# Patient Record
Sex: Female | Born: 1985 | Marital: Single | Smoking: Never smoker
Health system: Southern US, Community
[De-identification: ages and names within clinical notes are randomized; demographics above are authoritative.]

## PROBLEM LIST (undated history)

## (undated) DIAGNOSIS — I82409 Acute embolism and thrombosis of unspecified deep veins of unspecified lower extremity: Secondary | ICD-10-CM

## (undated) DIAGNOSIS — J45909 Unspecified asthma, uncomplicated: Secondary | ICD-10-CM

## (undated) DIAGNOSIS — I2699 Other pulmonary embolism without acute cor pulmonale: Secondary | ICD-10-CM

## (undated) HISTORY — PX: BACK SURGERY: SHX140

## (undated) HISTORY — DX: Unspecified asthma, uncomplicated: J45.909

---

## 2011-10-15 ENCOUNTER — Other Ambulatory Visit (HOSPITAL_COMMUNITY)
Admission: RE | Admit: 2011-10-15 | Discharge: 2011-10-15 | Disposition: A | Discharge status: Home / Self Care | Payer: 59 | Source: Ambulatory Visit | Attending: Family Medicine | Admitting: Family Medicine

## 2011-10-15 DIAGNOSIS — Z0000 Encounter for general adult medical examination without abnormal findings: Secondary | ICD-10-CM | POA: Insufficient documentation

## 2015-02-10 DIAGNOSIS — E7800 Pure hypercholesterolemia, unspecified: Secondary | ICD-10-CM | POA: Insufficient documentation

## 2016-07-17 ENCOUNTER — Emergency Department (HOSPITAL_COMMUNITY)
Admission: EM | Admit: 2016-07-17 | Discharge: 2016-07-17 | Disposition: A | Discharge status: Home / Self Care | Payer: BLUE CROSS/BLUE SHIELD | Attending: Emergency Medicine | Admitting: Emergency Medicine

## 2016-07-17 ENCOUNTER — Encounter (HOSPITAL_COMMUNITY): Payer: Self-pay | Admitting: Emergency Medicine

## 2016-07-17 DIAGNOSIS — M542 Cervicalgia: Secondary | ICD-10-CM | POA: Diagnosis present

## 2016-07-17 DIAGNOSIS — M62838 Other muscle spasm: Secondary | ICD-10-CM

## 2016-07-17 DIAGNOSIS — M62830 Muscle spasm of back: Secondary | ICD-10-CM | POA: Insufficient documentation

## 2016-07-17 DIAGNOSIS — M549 Dorsalgia, unspecified: Secondary | ICD-10-CM

## 2016-07-17 MED ORDER — METHOCARBAMOL 500 MG PO TABS: 1000.0000 mg | ORAL_TABLET | Freq: Four times a day (QID) | ORAL | 0 refills | Status: DC

## 2016-07-17 MED ORDER — OXYCODONE-ACETAMINOPHEN 5-325 MG PO TABS
ORAL_TABLET | ORAL | Status: AC
  Filled 2016-07-17: qty 1

## 2016-07-17 MED ORDER — HYDROCODONE-ACETAMINOPHEN 5-325 MG PO TABS: ORAL_TABLET | ORAL | 0 refills | Status: DC

## 2016-07-17 MED ORDER — KETOROLAC TROMETHAMINE 15 MG/ML IJ SOLN
15.0000 mg | Freq: Once | INTRAMUSCULAR | Status: AC
  Administered 2016-07-17: 15 mg via INTRAVENOUS
  Filled 2016-07-17: qty 1

## 2016-07-17 MED ORDER — OXYCODONE-ACETAMINOPHEN 5-325 MG PO TABS
1.0000 | ORAL_TABLET | Freq: Once | ORAL | Status: AC
  Administered 2016-07-17: 1 via ORAL

## 2016-07-17 MED ORDER — HYDROMORPHONE HCL 1 MG/ML IJ SOLN
0.5000 mg | Freq: Once | INTRAMUSCULAR | Status: AC
  Administered 2016-07-17: 0.5 mg via INTRAVENOUS
  Filled 2016-07-17: qty 1

## 2016-07-17 MED ORDER — ONDANSETRON HCL 4 MG/2ML IJ SOLN
4.0000 mg | Freq: Once | INTRAMUSCULAR | Status: AC
  Administered 2016-07-17: 4 mg via INTRAVENOUS
  Filled 2016-07-17: qty 2

## 2016-07-17 MED ORDER — DIAZEPAM 5 MG PO TABS
5.0000 mg | ORAL_TABLET | Freq: Once | ORAL | Status: AC
  Administered 2016-07-17: 5 mg via ORAL
  Filled 2016-07-17: qty 1

## 2016-07-17 MED ORDER — NAPROXEN 500 MG PO TABS: 500.0000 mg | ORAL_TABLET | Freq: Two times a day (BID) | ORAL | 0 refills | Status: DC

## 2016-07-17 MED ORDER — FENTANYL CITRATE (PF) 100 MCG/2ML IJ SOLN
50.0000 ug | Freq: Once | INTRAMUSCULAR | Status: AC
  Administered 2016-07-17: 50 ug via INTRAVENOUS
  Filled 2016-07-17: qty 2

## 2016-07-17 NOTE — ED Notes (Signed)
Pt requested a ice pack for neck. Pt given a small ice pack for back of neck and a large ice pack for back pain

## 2016-07-17 NOTE — Discharge Instructions (Signed)
Please read and follow all provided instructions.  Your diagnoses today include:  1. Muscle spasm   2. Acute upper back pain     Tests performed today include:  Vital signs - see below for your results today  Medications prescribed:   Robaxin (methocarbamol) - muscle relaxer medication  DO NOT drive or perform any activities that require you to be awake and alert because this medicine can make you drowsy.    Naproxen - anti-inflammatory pain medication  Do not exceed 500mg  naproxen every 12 hours, take with food  You have been prescribed an anti-inflammatory medication or NSAID. Take with food. Take smallest effective dose for the shortest duration needed for your pain. Stop taking if you experience stomach pain or vomiting.    Vicodin (hydrocodone/acetaminophen) - narcotic pain medication  DO NOT drive or perform any activities that require you to be awake and alert because this medicine can make you drowsy. BE VERY CAREFUL not to take multiple medicines containing Tylenol (also called acetaminophen). Doing so can lead to an overdose which can damage your liver and cause liver failure and possibly death.  Take any prescribed medications only as directed.  Home care instructions:   Follow any educational materials contained in this packet  Please rest, use ice or heat on your back for the next several days  Do not lift, push, pull anything more than 10 pounds for the next week  Follow-up instructions: Please follow-up with your primary care provider in the next 1 week for further evaluation of your symptoms.   Return instructions:  SEEK IMMEDIATE MEDICAL ATTENTION IF YOU HAVE:  New numbness, tingling, weakness, or problem with the use of your arms or legs  Severe back pain not relieved with medications  Loss control of your bowels or bladder  Increasing pain in any areas of the body (such as chest or abdominal pain)  Shortness of breath, dizziness, or fainting.     Worsening nausea (feeling sick to your stomach), vomiting, fever, or sweats  Any other emergent concerns regarding your health   Additional Information:  Your vital signs today were: BP 120/77    Pulse 65    Temp 98.4 F (36.9 C) (Oral)    Resp 18    Ht 5\' 9"  (1.753 m)    Wt 170 lb (77.1 kg)    LMP 05/03/2016 (Approximate)    SpO2 99%    BMI 25.10 kg/m  If your blood pressure (BP) was elevated above 135/85 this visit, please have this repeated by your doctor within one month. --------------

## 2016-07-17 NOTE — ED Triage Notes (Signed)
Pt reports a hx of neck and back spasms however she is now experiencing the worse spasms ever.  States she has been lifting boxes and moving things then picked up her dog which she thinks caused this spasm.  She is unable to get relief w/ ibuprofen or rest as she usually does.

## 2016-07-17 NOTE — ED Provider Notes (Signed)
[facility]-EMERGENCY DEPT Provider Note   CSN: [id number] Arrival date & time: 07/17/16  0419     History   Chief Complaint Chief Complaint  Patient presents with  . Back Pain  . Neck Pain    HPI Bianca Jacobs is a 31 y.o. female.  Patient with no significant past medical problems presents to the emergency department with complaint of bilateral neck, back, shoulder and upper arm pain over the past 2 days. Symptoms were mild at onset. They became worse yesterday after she bent over to separate her dogs. Overnight they became severe prompting emergency department visit. She also has been moving and lifting boxes. Patient took ibuprofen without relief. No vision changes, weakness in her arms, difficulty walking. No other recent trauma or manipulations to her neck. Pain is worse with any movement of the head and shoulders or with palpation. No lower back pain. No difficulty breathing, shortness of breath or chest pains. The onset of this condition was acute. The course is constant. Aggravating factors: none. Alleviating factors: none.        History reviewed. No pertinent past medical history.  There are no active problems to display for this patient.   History reviewed. No pertinent surgical history.  OB History    No data available       Home Medications    Prior to Admission medications   Not on File    Family History No family history on file.  Social History Social History  Substance Use Topics  . Smoking status: Not on file  . Smokeless tobacco: Not on file  . Alcohol use Not on file     Allergies   Patient has no known allergies.   Review of Systems Review of Systems  Constitutional: Negative for fever and unexpected weight change.  Eyes: Negative for visual disturbance.  Gastrointestinal: Negative for constipation.       Negative for fecal incontinence.   Genitourinary: Negative for dysuria.       Negative for urinary incontinence or retention.    Musculoskeletal: Positive for back pain, myalgias, neck pain and neck stiffness.  Neurological: Negative for weakness and numbness.       Denies saddle paresthesias.     Physical Exam Updated Vital Signs BP 120/77   Pulse 65   Temp 98.4 F (36.9 C) (Oral)   Resp 18   Ht 5\' 9"  (1.753 m)   Wt 170 lb (77.1 kg)   LMP 05/03/2016 (Approximate)   SpO2 99%   BMI 25.10 kg/m   Physical Exam  Constitutional: She appears well-developed and well-nourished.  HENT:  Head: Normocephalic and atraumatic.  Mouth/Throat: Oropharynx is clear and moist.  Eyes: Conjunctivae are normal.  Neck: Normal range of motion. Neck supple. Carotid bruit is not present.  Cardiovascular: Normal rate and regular rhythm.   Pulses:      Radial pulses are 2+ on the right side, and 2+ on the left side.  Pulmonary/Chest: Effort normal. No respiratory distress. She has no wheezes. She has no rales.  Good air movement bilaterally.   Abdominal: Soft. There is no tenderness. There is no CVA tenderness.  Musculoskeletal:       Right shoulder: She exhibits tenderness. She exhibits normal range of motion and no bony tenderness.       Left shoulder: She exhibits tenderness. She exhibits normal range of motion and no bony tenderness.       Right elbow: Normal.      Left elbow: Normal.  Right wrist: Normal.       Left wrist: Normal.       Cervical back: She exhibits decreased range of motion and tenderness. She exhibits no bony tenderness.       Thoracic back: Normal.       Lumbar back: Normal.       Right upper arm: Normal.       Left upper arm: Normal.       Arms: No step-off noted with palpation of spine.   Neurological: She is alert. She has normal strength and normal reflexes. No sensory deficit.  5/5 strength in entire lower extremities bilaterally. No sensation deficit.   Skin: Skin is warm and dry. No rash noted.  Psychiatric: She has a normal mood and affect.  Nursing note and vitals  reviewed.    ED Treatments / Results   Procedures Procedures (including critical care time)  Medications Ordered in ED Medications  oxyCODONE-acetaminophen (PERCOCET/ROXICET) 5-325 MG per tablet (not administered)  ketorolac (TORADOL) 15 MG/ML injection 15 mg (not administered)  fentaNYL (SUBLIMAZE) injection 50 mcg (not administered)  diazepam (VALIUM) tablet 5 mg (not administered)  oxyCODONE-acetaminophen (PERCOCET/ROXICET) 5-325 MG per tablet 1 tablet (1 tablet Oral Given 07/17/16 0453)     Initial Impression / Assessment and Plan / ED Course  I have reviewed the triage vital signs and the nursing notes.  Pertinent labs & imaging results that were available during my care of the patient were reviewed by me and considered in my medical decision making (see chart for details).     Patient seen and examined. Medications ordered. Doubt pneumothorax. Doubt vascular injury in neck. Exam consistent with MSK pain.   Vital signs reviewed and are as follows: BP 120/77   Pulse 65   Temp 98.4 F (36.9 C) (Oral)   Resp 18   Ht 5\' 9"  (1.753 m)   Wt 170 lb (77.1 kg)   LMP 05/03/2016 (Approximate)   SpO2 99%   BMI 25.10 kg/m   9:51 AM Pt continues to have pain. She is nauseous. Zofran and 0.5mg  dilaudid ordered.    11:29 AM Pt rechecked. Symptoms overall improved. Pain 5/10. Still slow with range of motion. Exam is stable.   We discussed home treatments including medications, heat, gentle stretching. She seems comfortable with discharge to home at this time.   Encouraged patient to return with any worsening symptoms, uncontrolled symptoms, new symptoms including weakness in arms or legs, vision change, vomiting, new concerns. She verbalizes understanding and agrees with plan.   Patient counseled on use of narcotic pain medications. Counseled not to combine these medications with others containing tylenol. Urged not to drink alcohol, drive, or perform any other activities that  requires focus while taking these medications. The patient verbalizes understanding and agrees with the plan.  Patient counseled on proper use of muscle relaxant medication.  They were told not to drink alcohol, drive any vehicle, or do any dangerous activities while taking this medication.  Patient verbalized understanding.   Final Clinical Impressions(s) / ED Diagnoses   Final diagnoses:  Muscle spasm  Acute upper back pain   Patient with upper back pain and spasm, likely exacerbated by recent heavy lifting and bending. Patient has no neurological deficits to suggest vascular etiology in the neck. No extremity numbness or weakness. No red flag symptoms. Symptoms improved but not resolved in the emergency department. Will continue treatments at home as above.  New Prescriptions New Prescriptions   HYDROCODONE-ACETAMINOPHEN (NORCO/VICODIN) 5-325 MG  TABLET    Take 1-2 tablets every 6 hours as needed for severe pain   METHOCARBAMOL (ROBAXIN) 500 MG TABLET    Take 2 tablets (1,000 mg total) by mouth 4 (four) times daily.   NAPROXEN (NAPROSYN) 500 MG TABLET    Take 1 tablet (500 mg total) by mouth 2 (two) times daily.     Renne CriglerGeiple, Edrik Rundle, PA-C 07/17/16 1203    Mancel BaleWentz, Elliott, MD 07/17/16 [phone]

## 2017-09-12 DIAGNOSIS — Z0000 Encounter for general adult medical examination without abnormal findings: Secondary | ICD-10-CM | POA: Diagnosis not present

## 2017-12-22 DIAGNOSIS — M545 Low back pain: Secondary | ICD-10-CM | POA: Diagnosis not present

## 2018-01-09 ENCOUNTER — Encounter: Payer: Self-pay | Admitting: Emergency Medicine

## 2018-01-09 ENCOUNTER — Emergency Department (HOSPITAL_COMMUNITY): Payer: Worker's Compensation

## 2018-01-09 ENCOUNTER — Emergency Department (HOSPITAL_COMMUNITY)
Admission: EM | Admit: 2018-01-09 | Discharge: 2018-01-09 | Disposition: A | Discharge status: Home / Self Care | Payer: Worker's Compensation | Attending: Emergency Medicine | Admitting: Emergency Medicine

## 2018-01-09 DIAGNOSIS — Z79899 Other long term (current) drug therapy: Secondary | ICD-10-CM | POA: Diagnosis not present

## 2018-01-09 DIAGNOSIS — R112 Nausea with vomiting, unspecified: Secondary | ICD-10-CM | POA: Insufficient documentation

## 2018-01-09 DIAGNOSIS — R51 Headache: Secondary | ICD-10-CM | POA: Insufficient documentation

## 2018-01-09 DIAGNOSIS — R519 Headache, unspecified: Secondary | ICD-10-CM

## 2018-01-09 LAB — CBC WITH DIFFERENTIAL/PLATELET
ABS IMMATURE GRANULOCYTES: 0.04 10*3/uL (ref 0.00–0.07)
Basophils Absolute: 0.1 10*3/uL (ref 0.0–0.1)
Basophils Relative: 1 %
EOS ABS: 0 10*3/uL (ref 0.0–0.5)
Eosinophils Relative: 0 %
HEMATOCRIT: 44.1 % (ref 36.0–46.0)
HEMOGLOBIN: 14.5 g/dL (ref 12.0–15.0)
IMMATURE GRANULOCYTES: 0 %
LYMPHS ABS: 1.5 10*3/uL (ref 0.7–4.0)
Lymphocytes Relative: 12 %
MCH: 28.8 pg (ref 26.0–34.0)
MCHC: 32.9 g/dL (ref 30.0–36.0)
MCV: 87.7 fL (ref 80.0–100.0)
MONOS PCT: 4 %
Monocytes Absolute: 0.5 10*3/uL (ref 0.1–1.0)
NEUTROS ABS: 10.1 10*3/uL — AB (ref 1.7–7.7)
NEUTROS PCT: 83 %
Platelets: 268 10*3/uL (ref 150–400)
RBC: 5.03 MIL/uL (ref 3.87–5.11)
RDW: 11.9 % (ref 11.5–15.5)
WBC: 12.2 10*3/uL — ABNORMAL HIGH (ref 4.0–10.5)
nRBC: 0 % (ref 0.0–0.2)

## 2018-01-09 LAB — COMPREHENSIVE METABOLIC PANEL
ALBUMIN: 4 g/dL (ref 3.5–5.0)
ALK PHOS: 48 U/L (ref 38–126)
ALT: 16 U/L (ref 0–44)
AST: 17 U/L (ref 15–41)
Anion gap: 9 (ref 5–15)
BUN: 6 mg/dL (ref 6–20)
CO2: 22 mmol/L (ref 22–32)
CREATININE: 0.68 mg/dL (ref 0.44–1.00)
Calcium: 9.4 mg/dL (ref 8.9–10.3)
Chloride: 107 mmol/L (ref 98–111)
GFR calc Af Amer: >60 mL/min (ref >60)
GFR calc non Af Amer: >60 mL/min (ref >60)
GLUCOSE: 106 mg/dL — AB (ref 70–99)
Potassium: 3.7 mmol/L (ref 3.5–5.1)
SODIUM: 138 mmol/L (ref 135–145)
Total Bilirubin: 0.9 mg/dL (ref 0.3–1.2)
Total Protein: 7.3 g/dL (ref 6.5–8.1)

## 2018-01-09 LAB — I-STAT CHEM 8, ED
BUN: 9 mg/dL (ref 6–20)
CREATININE: 0.6 mg/dL (ref 0.44–1.00)
Calcium, Ion: 1.16 mmol/L (ref 1.15–1.40)
Chloride: 106 mmol/L (ref 98–111)
GLUCOSE: 98 mg/dL (ref 70–99)
HCT: 45 % (ref 36.0–46.0)
HEMOGLOBIN: 15.3 g/dL — AB (ref 12.0–15.0)
Potassium: 4.4 mmol/L (ref 3.5–5.1)
Sodium: 139 mmol/L (ref 135–145)
TCO2: 27 mmol/L (ref 22–32)

## 2018-01-09 LAB — I-STAT BETA HCG BLOOD, ED (MC, WL, AP ONLY): I-stat hCG, quantitative: <5 m[IU]/mL (ref <5)

## 2018-01-09 MED ORDER — DIPHENHYDRAMINE HCL 50 MG/ML IJ SOLN
12.5000 mg | Freq: Once | INTRAMUSCULAR | Status: AC
  Administered 2018-01-09: 12.5 mg via INTRAVENOUS
  Filled 2018-01-09: qty 1

## 2018-01-09 MED ORDER — SODIUM CHLORIDE 0.9 % IV BOLUS
1000.0000 mL | Freq: Once | INTRAVENOUS | Status: AC
  Administered 2018-01-09: 1000 mL via INTRAVENOUS

## 2018-01-09 MED ORDER — IOPAMIDOL (ISOVUE-370) INJECTION 76%
75.0000 mL | Freq: Once | INTRAVENOUS | Status: AC | PRN
  Administered 2018-01-09: 75 mL via INTRAVENOUS

## 2018-01-09 MED ORDER — METOCLOPRAMIDE HCL 5 MG/ML IJ SOLN
10.0000 mg | Freq: Once | INTRAMUSCULAR | Status: AC
  Administered 2018-01-09: 10 mg via INTRAVENOUS
  Filled 2018-01-09: qty 2

## 2018-01-09 MED ORDER — KETOROLAC TROMETHAMINE 15 MG/ML IJ SOLN
15.0000 mg | Freq: Once | INTRAMUSCULAR | Status: AC
  Administered 2018-01-09: 15 mg via INTRAVENOUS
  Filled 2018-01-09: qty 1

## 2018-01-09 MED ORDER — PROCHLORPERAZINE EDISYLATE 10 MG/2ML IJ SOLN
10.0000 mg | Freq: Once | INTRAMUSCULAR | Status: AC
  Administered 2018-01-09: 10 mg via INTRAVENOUS
  Filled 2018-01-09: qty 2

## 2018-01-09 MED ORDER — IOPAMIDOL (ISOVUE-370) INJECTION 76%
INTRAVENOUS | Status: AC
  Filled 2018-01-09: qty 100

## 2018-01-09 MED ORDER — ONDANSETRON 4 MG PO TBDP: 4.0000 mg | ORAL_TABLET | Freq: Three times a day (TID) | ORAL | 0 refills | Status: DC | PRN

## 2018-01-09 NOTE — ED Provider Notes (Signed)
[facility] EMERGENCY DEPARTMENT Provider Note   CSN: [id number] Arrival date & time: 01/09/18  1844     History   Chief Complaint Chief Complaint  Patient presents with  . Headache  . Emesis    HPI Bianca Jacobs is a 32 y.o. female.  HPI Patient is a 32 year old female with no significant past medical history presents the emergency department for evaluation of headache.  Patient reports that her headache began acutely on Saturday night while eating dinner.  States that she was in her normal state of health prior to the onset of her headache.  States that the headache was holocephalic, nonradiating, worse with movement or standing, and does have some associated nausea and vomiting.  She is also had associated photophobia and phonophobia.  Patient was reportedly evaluated at an urgent care prior to coming to the emergency department, they were concerned that patient may have experienced a sub-arachnoid hemorrhage versus meningitis and as a result sent the patient to the emergency department for further evaluation.  Upon arrival patient continues to appear uncomfortable.  She states her headache is still present.  Denies any numbness, weakness, tingling, vision changes, neck stiffness, fevers, chills, or chest pain.  She does endorse intermittent loose stools over the past few days.  Remaining review of systems as below.  History reviewed. No pertinent past medical history.  There are no active problems to display for this patient.   History reviewed. No pertinent surgical history.   OB History   None      Home Medications    Prior to Admission medications   Medication Sig Start Date End Date Taking? Authorizing Provider  HYDROcodone-acetaminophen (NORCO/VICODIN) 5-325 MG tablet Take 1-2 tablets every 6 hours as needed for severe pain 07/17/16   Renne Crigler, PA-C  methocarbamol (ROBAXIN) 500 MG tablet Take 2 tablets (1,000 mg total) by mouth 4 (four)  times daily. 07/17/16   Renne Crigler, PA-C  naproxen (NAPROSYN) 500 MG tablet Take 1 tablet (500 mg total) by mouth 2 (two) times daily. 07/17/16   Renne Crigler, PA-C  ondansetron (ZOFRAN ODT) 4 MG disintegrating tablet Take 1 tablet (4 mg total) by mouth every 8 (eight) hours as needed for nausea or vomiting. 01/09/18   Shaima Sardinas, Winfield Rast, MD    Family History No family history on file.  Social History Social History   Tobacco Use  . Smoking status: Never Smoker  . Smokeless tobacco: Never Used  Substance Use Topics  . Alcohol use: Not Currently  . Drug use: Not Currently     Allergies   Patient has no known allergies.   Review of Systems Review of Systems  Constitutional: Negative for chills and fever.  HENT: Negative for ear pain and sore throat.   Eyes: Negative for pain and visual disturbance.  Respiratory: Negative for cough and shortness of breath.   Cardiovascular: Negative for chest pain and palpitations.  Gastrointestinal: Positive for diarrhea, nausea and vomiting. Negative for abdominal pain.  Genitourinary: Negative for dysuria and hematuria.  Musculoskeletal: Negative for arthralgias and back pain.  Skin: Negative for color change and rash.  Neurological: Positive for dizziness and headaches. Negative for seizures, syncope and weakness.  Psychiatric/Behavioral: Negative for agitation, behavioral problems and confusion.  All other systems reviewed and are negative.    Physical Exam Updated Vital Signs BP 126/75   Pulse 67   Temp 98 F (36.7 C) (Oral)   Resp 15   Ht 5\' 9"  (1.753 m)  Wt 77.1 kg   LMP 12/10/2017   SpO2 98%   BMI 25.10 kg/m   Physical Exam  Constitutional: She is oriented to person, place, and time. She appears well-developed and well-nourished. No distress.  HENT:  Head: Normocephalic and atraumatic.  Eyes: Pupils are equal, round, and reactive to light. Conjunctivae and EOM are normal. Right eye exhibits normal extraocular  motion and no nystagmus. Left eye exhibits normal extraocular motion and no nystagmus.  Neck: Neck supple.  Cardiovascular: Normal rate and regular rhythm.  No murmur heard. Pulmonary/Chest: Effort normal and breath sounds normal. No respiratory distress.  Abdominal: Soft. There is no tenderness.  Musculoskeletal: She exhibits no edema.  Neurological: She is alert and oriented to person, place, and time. She has normal strength. She is not disoriented. No cranial nerve deficit or sensory deficit. Coordination and gait normal. GCS eye subscore is 4. GCS verbal subscore is 5. GCS motor subscore is 6.  Patient did report that she became dizzy upon standing and did have one episode of vomiting following standing.  After standing patient did not have any nystagmus noted.  No nystagmus with position changes.  Normal finger-to-nose and heel-to-shin.  Skin: Skin is warm and dry.  Psychiatric: She has a normal mood and affect.  Nursing note and vitals reviewed.    ED Treatments / Results  Labs (all labs ordered are listed, but only abnormal results are displayed) Labs Reviewed  CBC WITH DIFFERENTIAL/PLATELET - Abnormal; Notable for the following components:      Result Value   WBC 12.2 (*)    Neutro Abs 10.1 (*)    All other components within normal limits  COMPREHENSIVE METABOLIC PANEL - Abnormal; Notable for the following components:   Glucose, Bld 106 (*)    All other components within normal limits  I-STAT CHEM 8, ED - Abnormal; Notable for the following components:   Hemoglobin 15.3 (*)    All other components within normal limits  I-STAT BETA HCG BLOOD, ED ([facility], [facility], [facility] ONLY)    EKG None  Radiology Ct Angio Head W Or Wo Contrast  Result Date: 01/09/2018 CLINICAL DATA:  Headache with vomiting EXAM: CT ANGIOGRAPHY HEAD AND NECK TECHNIQUE: Multidetector CT imaging of the head and neck was performed using the standard protocol during bolus administration of intravenous contrast.  Multiplanar CT image reconstructions and MIPs were obtained to evaluate the vascular anatomy. Carotid stenosis measurements (when applicable) are obtained utilizing NASCET criteria, using the distal internal carotid diameter as the denominator. CONTRAST:  75mL ISOVUE-370 IOPAMIDOL (ISOVUE-370) INJECTION 76% COMPARISON:  None. FINDINGS: Noncontrast CT head was not performed. CTA NECK FINDINGS SKELETON: There is no bony spinal canal stenosis. No lytic or blastic lesion. OTHER NECK: Normal pharynx, larynx and major salivary glands. No cervical lymphadenopathy. Unremarkable thyroid gland. UPPER CHEST: No pneumothorax or pleural effusion. No nodules or masses. AORTIC ARCH: There is no calcific atherosclerosis of the aortic arch. There is no aneurysm, dissection or hemodynamically significant stenosis of the visualized ascending aorta and aortic arch. Normal variant aortic arch branching pattern with the left vertebral artery arising independently from the aortic arch. The visualized proximal subclavian arteries are widely patent. RIGHT CAROTID SYSTEM: --Common carotid artery: Widely patent origin without common carotid artery dissection or aneurysm. --Internal carotid artery: No dissection, occlusion or aneurysm. No hemodynamically significant stenosis. --External carotid artery: No acute abnormality. LEFT CAROTID SYSTEM: --Common carotid artery: Widely patent origin without common carotid artery dissection or aneurysm. --Internal carotid artery: No dissection, occlusion  or aneurysm. No hemodynamically significant stenosis. --External carotid artery: No acute abnormality. VERTEBRAL ARTERIES: Right dominant configuration. Both origins are normal. No dissection, occlusion or flow-limiting stenosis to the vertebrobasilar confluence. CTA HEAD FINDINGS ANTERIOR CIRCULATION: --Intracranial internal carotid arteries: Normal. --Anterior cerebral arteries: Normal. Both A1 segments are present. Patent anterior communicating  artery. --Middle cerebral arteries: Normal. --Posterior communicating arteries: Absent bilaterally. POSTERIOR CIRCULATION: --Basilar artery: Normal. --Posterior cerebral arteries: Normal. --Superior cerebellar arteries: Normal. --Inferior cerebellar arteries: Normal anterior and posterior inferior cerebellar arteries. VENOUS SINUSES: As permitted by contrast timing, patent. ANATOMIC VARIANTS: None DELAYED PHASE: No parenchymal contrast enhancement. Review of the MIP images confirms the above findings. IMPRESSION: Normal CTA of the head and neck. Electronically Signed   By: Deatra Robinson M.D.   On: 01/09/2018 21:43   Ct Angio Neck W And/or Wo Contrast  Result Date: 01/09/2018 CLINICAL DATA:  Headache with vomiting EXAM: CT ANGIOGRAPHY HEAD AND NECK TECHNIQUE: Multidetector CT imaging of the head and neck was performed using the standard protocol during bolus administration of intravenous contrast. Multiplanar CT image reconstructions and MIPs were obtained to evaluate the vascular anatomy. Carotid stenosis measurements (when applicable) are obtained utilizing NASCET criteria, using the distal internal carotid diameter as the denominator. CONTRAST:  75mL ISOVUE-370 IOPAMIDOL (ISOVUE-370) INJECTION 76% COMPARISON:  None. FINDINGS: Noncontrast CT head was not performed. CTA NECK FINDINGS SKELETON: There is no bony spinal canal stenosis. No lytic or blastic lesion. OTHER NECK: Normal pharynx, larynx and major salivary glands. No cervical lymphadenopathy. Unremarkable thyroid gland. UPPER CHEST: No pneumothorax or pleural effusion. No nodules or masses. AORTIC ARCH: There is no calcific atherosclerosis of the aortic arch. There is no aneurysm, dissection or hemodynamically significant stenosis of the visualized ascending aorta and aortic arch. Normal variant aortic arch branching pattern with the left vertebral artery arising independently from the aortic arch. The visualized proximal subclavian arteries are widely  patent. RIGHT CAROTID SYSTEM: --Common carotid artery: Widely patent origin without common carotid artery dissection or aneurysm. --Internal carotid artery: No dissection, occlusion or aneurysm. No hemodynamically significant stenosis. --External carotid artery: No acute abnormality. LEFT CAROTID SYSTEM: --Common carotid artery: Widely patent origin without common carotid artery dissection or aneurysm. --Internal carotid artery: No dissection, occlusion or aneurysm. No hemodynamically significant stenosis. --External carotid artery: No acute abnormality. VERTEBRAL ARTERIES: Right dominant configuration. Both origins are normal. No dissection, occlusion or flow-limiting stenosis to the vertebrobasilar confluence. CTA HEAD FINDINGS ANTERIOR CIRCULATION: --Intracranial internal carotid arteries: Normal. --Anterior cerebral arteries: Normal. Both A1 segments are present. Patent anterior communicating artery. --Middle cerebral arteries: Normal. --Posterior communicating arteries: Absent bilaterally. POSTERIOR CIRCULATION: --Basilar artery: Normal. --Posterior cerebral arteries: Normal. --Superior cerebellar arteries: Normal. --Inferior cerebellar arteries: Normal anterior and posterior inferior cerebellar arteries. VENOUS SINUSES: As permitted by contrast timing, patent. ANATOMIC VARIANTS: None DELAYED PHASE: No parenchymal contrast enhancement. Review of the MIP images confirms the above findings. IMPRESSION: Normal CTA of the head and neck. Electronically Signed   By: Deatra Robinson M.D.   On: 01/09/2018 21:43    Procedures Procedures (including critical care time)  Medications Ordered in ED Medications  prochlorperazine (COMPAZINE) injection 10 mg (10 mg Intravenous Given 01/09/18 1939)  diphenhydrAMINE (BENADRYL) injection 12.5 mg (12.5 mg Intravenous Given 01/09/18 1939)  sodium chloride 0.9 % bolus 1,000 mL (0 mLs Intravenous Stopped 01/09/18 2100)  iopamidol (ISOVUE-370) 76 % injection 75 mL (75 mLs  Intravenous Contrast Given 01/09/18 2112)  metoCLOPramide (REGLAN) injection 10 mg (10 mg Intravenous Given 01/09/18 2221)  ketorolac (  TORADOL) 15 MG/ML injection 15 mg (15 mg Intravenous Given 01/09/18 2221)     Initial Impression / Assessment and Plan / ED Course  I have reviewed the triage vital signs and the nursing notes.  Pertinent labs & imaging results that were available during my care of the patient were reviewed by me and considered in my medical decision making (see chart for details).     Patient is a 32 year old female with past medical history as detailed above who presents to the emergency department for evaluation of a headache with associated nausea and vomiting.  Given patient's arrival complaint CTA of the head and neck was obtained.  Patient's CTA does not show any acute findings.  She was given a migraine cocktail with improvement of her symptoms.  On reevaluation patient continues to have no neurological symptoms.  She states that she is feeling much better and ready for discharge.  Patient follow-up with her primary care physician in the next few days and was given information to schedule an appointment with neurology for further evaluation.  At this time patient's headache is most consistent with a migraine type headache.  Subarachnoid hemorrhage considered but unlikely given negative CTA of the head and neck.  Other intracranial abnormalities also less likely at this time given patient's reassuring.  No evidence of meningitis on exam.  No head trauma to suggest postconcussive headache.  Patient is appropriate for discharge at this time and management as an outpatient.  She was given a prescription for Zofran given her continued nausea.  The care of this patient was discussed with my attending physician Dr. Silverio Lay, who voices agreement with work-up and ED disposition.  Final Clinical Impressions(s) / ED Diagnoses   Final diagnoses:  Acute nonintractable headache,  unspecified headache type    ED Discharge Orders         Ordered    ondansetron (ZOFRAN ODT) 4 MG disintegrating tablet  Every 8 hours PRN     01/09/18 2212           Keith Rake, MD 01/10/18 1507    Charlynne Pander, MD 01/11/18 1520

## 2018-01-09 NOTE — ED Triage Notes (Signed)
Pt arrives c/o headache starting Saturday, reporting started all of a sudden and progressively worse. Started vomiting today, x3. Bile noted. Pt reports no diarrhea, but having stools more frequently.

## 2018-01-09 NOTE — ED Notes (Signed)
Patient verbalizes understanding of discharge instructions. Opportunity for questioning and answers were provided. Armband removed by staff, pt discharged from ED to home with family 

## 2018-01-11 ENCOUNTER — Encounter: Payer: Self-pay | Admitting: Neurology

## 2018-01-12 DIAGNOSIS — R51 Headache: Secondary | ICD-10-CM | POA: Diagnosis not present

## 2018-01-19 ENCOUNTER — Encounter: Payer: Self-pay | Admitting: Neurology

## 2018-01-19 ENCOUNTER — Ambulatory Visit (INDEPENDENT_AMBULATORY_CARE_PROVIDER_SITE_OTHER): Payer: Worker's Compensation | Admitting: Neurology

## 2018-01-19 VITALS — BP 110/82 | HR 75 | Ht 69.0 in | Wt 168.0 lb

## 2018-01-19 DIAGNOSIS — R51 Headache: Secondary | ICD-10-CM | POA: Diagnosis not present

## 2018-01-19 DIAGNOSIS — G4452 New daily persistent headache (NDPH): Secondary | ICD-10-CM

## 2018-01-19 DIAGNOSIS — R519 Headache, unspecified: Secondary | ICD-10-CM

## 2018-01-19 MED ORDER — PROMETHAZINE HCL 25 MG PO TABS: 25.0000 mg | ORAL_TABLET | Freq: Four times a day (QID) | ORAL | 3 refills | Status: DC | PRN

## 2018-01-19 MED ORDER — NORTRIPTYLINE HCL 25 MG PO CAPS: 25.0000 mg | ORAL_CAPSULE | Freq: Every day | ORAL | 3 refills | Status: DC
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
## 2018-01-19 NOTE — Patient Instructions (Addendum)
1.  We will check MRI of brain with and without constrast 2.  In meantime, start nortriptyline 25mg  at bedtime 3.  For nausea, try promethazine 25mg  4.  While headaches are persistent, pain relievers will unlikely be helpful 5.  Follow up in 6 weeks.  We have sent a referral to [facility] and they will call you directly to schedule your appt. They are located at [address] If you need to contact them directly please call [phone].

## 2018-01-30 ENCOUNTER — Other Ambulatory Visit: Payer: Self-pay | Admitting: Neurology

## 2018-01-31 ENCOUNTER — Other Ambulatory Visit: Payer: Self-pay | Admitting: Neurology

## 2018-02-02 ENCOUNTER — Ambulatory Visit
Admission: RE | Admit: 2018-02-02 | Discharge: 2018-02-02 | Disposition: A | Discharge status: Home / Self Care | Payer: BLUE CROSS/BLUE SHIELD | Source: Ambulatory Visit | Attending: Neurology | Admitting: Neurology

## 2018-02-02 DIAGNOSIS — R519 Headache, unspecified: Secondary | ICD-10-CM

## 2018-02-02 DIAGNOSIS — R51 Headache: Principal | ICD-10-CM

## 2018-02-02 MED ORDER — GADOBENATE DIMEGLUMINE 529 MG/ML IV SOLN
15.0000 mL | Freq: Once | INTRAVENOUS | Status: AC | PRN
  Administered 2018-02-02: 15 mL via INTRAVENOUS

## 2018-02-06 ENCOUNTER — Telehealth: Payer: Self-pay

## 2018-02-06 ENCOUNTER — Telehealth: Payer: Self-pay | Admitting: Neurology

## 2018-02-06 NOTE — Telephone Encounter (Signed)
Called and spoke with Pt, advised her of results and faxinf referral

## 2018-02-06 NOTE — Telephone Encounter (Signed)
Called and advised Pt, and will send referral

## 2018-02-06 NOTE — Telephone Encounter (Signed)
Patient is calling in wanting her recent MRI results. Please call her at [phone]. Thanks!

## 2018-02-06 NOTE — Telephone Encounter (Signed)
-----   Message from Drema DallasAdam R Jaffe, DO sent at 02/03/2018  7:18 AM EST ----- MRI of brain show findings suggestive of spinal fluid leak somewhere in her spinal canal, which is the likely cause of her headaches.  I would like to refer her to Dr. Clayton LefortLinda Gray Leithe at [facility], who specializes in locating source of leak and treating it.

## 2018-02-06 NOTE — Telephone Encounter (Signed)
-----   Message from Adam R Jaffe, DO sent at 02/03/2018  7:18 AM EST ----- MRI of brain show findings suggestive of spinal fluid leak somewhere in her spinal canal, which is the likely cause of her headaches.  I would like to refer her to Dr. Linda Gray Leithe at [facility], who specializes in locating source of leak and treating it. 

## 2018-02-08 ENCOUNTER — Telehealth: Payer: Self-pay

## 2018-02-08 NOTE — Telephone Encounter (Signed)
Rcvd VM from [place] at [facility]. Called and spoke with Elease Hashimotoatricia, explained what was going on, she spoke with Marcelino DusterMichelle and transferred me to her. Marcelino DusterMichelle pushed images to PACS and took address to send CD.  In called Dr Gwenette GreetGray's office, LMOVM for Ander SladeJoy to return my call

## 2018-02-08 NOTE — Telephone Encounter (Signed)
Pt was notified of MRI results in phone messsage from 02/06/18  Pt was calling today to find out about referral to [facility] to Dr Sonia BallerLinda Gray Jacobs's office. I advised her that Bianca Jacobs from that office called me earlier about getting a CD sent to them and she should hear from soon.

## 2018-02-08 NOTE — Telephone Encounter (Signed)
Rcvd call from Joy at [facility]. They require a copy of the MRI brain CD mailed to them at:  [facility] [phone] C/O [facility] [address]  [place] [id number]  Called [facility], spoke with Drenda FreezeFran in MRI deparatment, advised her what I needed, she said I would need to speak with someone else, she transferred me to reception, LMOVM for return call with all of Pt's information.

## 2018-02-08 NOTE — Telephone Encounter (Signed)
Patient called and is needing to speak with you regarding her MRI. Please Call. Thanks

## 2018-02-13 ENCOUNTER — Telehealth: Payer: Self-pay | Admitting: Neurology

## 2018-02-13 NOTE — Telephone Encounter (Signed)
Patient is calling in stating that she still has not received a call from the [facility] department. Please call her back at [phone]. Thanks!

## 2018-02-15 NOTE — Telephone Encounter (Signed)
Called and spoke with Pt. She was contacted by Kateri Mcuke, one of the physician assistants asked her many clinical questions and advised her they would be back in touch by the end of the week. Pt said her headache has resolved, but still having back pain.
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
## 2018-03-02 ENCOUNTER — Ambulatory Visit (INDEPENDENT_AMBULATORY_CARE_PROVIDER_SITE_OTHER): Payer: Worker's Compensation | Admitting: Neurology

## 2018-03-02 ENCOUNTER — Encounter: Payer: Self-pay | Admitting: Neurology

## 2018-03-02 VITALS — BP 118/82 | HR 98 | Ht 69.0 in | Wt 174.0 lb

## 2018-03-02 DIAGNOSIS — R51 Headache with orthostatic component, not elsewhere classified: Secondary | ICD-10-CM

## 2018-03-02 NOTE — Patient Instructions (Signed)
1.  Continue nortriptyline for now. 2.  Treat headache with caffeine 3.  Follow up at [facility] 4.  Follow up with me after treatment at [facility].

## 2018-03-17 DIAGNOSIS — G960 Cerebrospinal fluid leak: Secondary | ICD-10-CM | POA: Diagnosis not present

## 2018-03-17 DIAGNOSIS — G9389 Other specified disorders of brain: Secondary | ICD-10-CM | POA: Diagnosis not present

## 2018-03-17 DIAGNOSIS — R51 Headache: Secondary | ICD-10-CM | POA: Diagnosis not present

## 2018-03-21 DIAGNOSIS — G9389 Other specified disorders of brain: Secondary | ICD-10-CM | POA: Diagnosis not present

## 2018-03-21 DIAGNOSIS — G960 Cerebrospinal fluid leak: Secondary | ICD-10-CM | POA: Diagnosis not present

## 2018-03-21 DIAGNOSIS — R51 Headache: Secondary | ICD-10-CM | POA: Diagnosis not present

## 2018-04-20 DIAGNOSIS — M5124 Other intervertebral disc displacement, thoracic region: Secondary | ICD-10-CM | POA: Diagnosis not present

## 2018-04-20 DIAGNOSIS — G960 Cerebrospinal fluid leak: Secondary | ICD-10-CM | POA: Diagnosis not present

## 2018-05-10 DIAGNOSIS — G960 Cerebrospinal fluid leak: Secondary | ICD-10-CM | POA: Diagnosis not present

## 2018-06-07 DIAGNOSIS — M5134 Other intervertebral disc degeneration, thoracic region: Secondary | ICD-10-CM | POA: Diagnosis not present

## 2018-06-07 DIAGNOSIS — G960 Cerebrospinal fluid leak: Secondary | ICD-10-CM | POA: Diagnosis not present

## 2018-08-01 DIAGNOSIS — M4324 Fusion of spine, thoracic region: Secondary | ICD-10-CM | POA: Insufficient documentation

## 2018-08-17 DIAGNOSIS — M4324 Fusion of spine, thoracic region: Secondary | ICD-10-CM | POA: Diagnosis not present

## 2018-08-17 DIAGNOSIS — G960 Cerebrospinal fluid leak: Secondary | ICD-10-CM | POA: Diagnosis not present

## 2018-08-17 DIAGNOSIS — M79605 Pain in left leg: Secondary | ICD-10-CM | POA: Diagnosis not present

## 2018-08-31 DIAGNOSIS — I82402 Acute embolism and thrombosis of unspecified deep veins of left lower extremity: Secondary | ICD-10-CM | POA: Insufficient documentation

## 2018-08-31 DIAGNOSIS — I824Z2 Acute embolism and thrombosis of unspecified deep veins of left distal lower extremity: Secondary | ICD-10-CM | POA: Diagnosis not present

## 2018-08-31 DIAGNOSIS — I2699 Other pulmonary embolism without acute cor pulmonale: Secondary | ICD-10-CM | POA: Diagnosis not present

## 2018-08-31 DIAGNOSIS — M79605 Pain in left leg: Secondary | ICD-10-CM | POA: Diagnosis not present

## 2018-08-31 DIAGNOSIS — J810 Acute pulmonary edema: Secondary | ICD-10-CM | POA: Diagnosis not present

## 2018-09-12 DIAGNOSIS — J810 Acute pulmonary edema: Secondary | ICD-10-CM | POA: Diagnosis not present

## 2018-09-12 DIAGNOSIS — Z86718 Personal history of other venous thrombosis and embolism: Secondary | ICD-10-CM | POA: Diagnosis not present

## 2018-09-12 DIAGNOSIS — K8020 Calculus of gallbladder without cholecystitis without obstruction: Secondary | ICD-10-CM | POA: Diagnosis not present

## 2018-09-12 DIAGNOSIS — I824Z2 Acute embolism and thrombosis of unspecified deep veins of left distal lower extremity: Secondary | ICD-10-CM | POA: Diagnosis not present

## 2018-09-14 DIAGNOSIS — I82422 Acute embolism and thrombosis of left iliac vein: Secondary | ICD-10-CM | POA: Diagnosis not present

## 2018-09-28 ENCOUNTER — Emergency Department (HOSPITAL_BASED_OUTPATIENT_CLINIC_OR_DEPARTMENT_OTHER): Payer: Worker's Compensation

## 2018-09-28 ENCOUNTER — Emergency Department (HOSPITAL_COMMUNITY)
Admission: EM | Admit: 2018-09-28 | Discharge: 2018-09-28 | Disposition: A | Discharge status: Home / Self Care | Payer: Worker's Compensation | Attending: Emergency Medicine | Admitting: Emergency Medicine

## 2018-09-28 ENCOUNTER — Encounter (HOSPITAL_COMMUNITY): Payer: Self-pay

## 2018-09-28 ENCOUNTER — Other Ambulatory Visit: Payer: Self-pay

## 2018-09-28 DIAGNOSIS — Z79899 Other long term (current) drug therapy: Secondary | ICD-10-CM | POA: Insufficient documentation

## 2018-09-28 DIAGNOSIS — R252 Cramp and spasm: Secondary | ICD-10-CM | POA: Diagnosis not present

## 2018-09-28 DIAGNOSIS — Z86718 Personal history of other venous thrombosis and embolism: Secondary | ICD-10-CM | POA: Insufficient documentation

## 2018-09-28 DIAGNOSIS — R52 Pain, unspecified: Secondary | ICD-10-CM

## 2018-09-28 DIAGNOSIS — Z7901 Long term (current) use of anticoagulants: Secondary | ICD-10-CM | POA: Diagnosis not present

## 2018-09-28 DIAGNOSIS — M79604 Pain in right leg: Secondary | ICD-10-CM | POA: Diagnosis present

## 2018-09-28 HISTORY — DX: Other pulmonary embolism without acute cor pulmonale: I26.99

## 2018-09-28 HISTORY — DX: Acute embolism and thrombosis of unspecified deep veins of unspecified lower extremity: I82.409

## 2018-09-28 LAB — BASIC METABOLIC PANEL
Anion gap: 10 (ref 5–15)
BUN: 7 mg/dL (ref 6–20)
CO2: 26 mmol/L (ref 22–32)
Calcium: 9.4 mg/dL (ref 8.9–10.3)
Chloride: 104 mmol/L (ref 98–111)
Creatinine, Ser: 0.57 mg/dL (ref 0.44–1.00)
GFR calc Af Amer: >60 mL/min (ref >60)
GFR calc non Af Amer: >60 mL/min (ref >60)
Glucose, Bld: 88 mg/dL (ref 70–99)
Potassium: 4.5 mmol/L (ref 3.5–5.1)
Sodium: 140 mmol/L (ref 135–145)

## 2018-09-28 NOTE — Discharge Instructions (Signed)
Your ultrasound of your right leg was normal, and your electrolytes are also normal.  It is likely that your cramps is from your muscle, and it should resolve with time.

## 2018-09-28 NOTE — ED Provider Notes (Signed)
[facility] DEPT Provider Note   CSN: [id number] Arrival date & time: 09/28/18  1331    History   Chief Complaint Chief Complaint  Patient presents with  . leg cramping    HPI Bianca Jacobs is a 33 y.o. 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  She says that the cramping woke her up this morning and has not gone away.  She is concerned that she has a blood clot in her right leg.  She takes Xarelto 20 mg daily and has not missed a dose.  She does not note any discrepancy in the diameter of her lower extremities.  She denies shortness of breath or chest pain.  She wears compression stockings daily.  She was taking oral contraceptives prior to her multiple blood clots but has since stopped taking this medication.     Past Medical History:  Diagnosis Date  . DVT (deep venous thrombosis) ([place])   . Pulmonary embolus ([facility])     There are no active problems to display for this patient.   Past Surgical History:  Procedure Laterality Date  . BACK SURGERY       OB History   No obstetric history on file.      Home Medications    Prior to Admission medications   Medication Sig Start Date End Date Taking? Authorizing Provider  acetaminophen (TYLENOL) 500 MG tablet Take 1,000 mg by mouth daily as needed for moderate pain or headache.   Yes [provider]  cetirizine (ZYRTEC) 10 MG tablet Take 10 mg by mouth daily.   Yes [provider]  gabapentin (NEURONTIN) 300 MG capsule Take 300 mg by mouth daily. 09/05/18  Yes [provider]  methocarbamol (ROBAXIN) 750 MG tablet Take 750 mg by mouth daily as needed for muscle spasms. 08/17/18  Yes [provider]  oxyCODONE (OXY IR/ROXICODONE) 5 MG immediate release tablet Take 5 mg by mouth daily as needed for pain. 08/04/18  Yes [provider]  XARELTO 20 MG TABS tablet Take 20 mg by mouth daily. 09/05/18  Yes [provider]  nortriptyline (PAMELOR) 25 MG capsule Take 1 capsule (25 mg total) by mouth at bedtime. Patient not taking: Reported on 09/28/2018 01/19/18 09/28/18  Pieter Partridge, DO  promethazine (PHENERGAN) 25 MG tablet Take 1 tablet (25 mg total) by mouth every 6 (six) hours as needed for nausea or vomiting. Patient not taking: Reported on 09/28/2018 01/19/18 09/28/18  Pieter Partridge, DO    Family History Family History  Problem Relation Age of Onset  . Hypertension Mother   . Migraines Mother   . Epilepsy Brother   . Epilepsy Brother     Social History Social History   Tobacco Use  . Smoking status: Never Smoker  . Smokeless tobacco: Never Used  Substance Use Topics  . Alcohol use: Not Currently  . Drug use: Not Currently     Allergies   Patient has no known allergies.   Review of Systems Review of Systems  Constitutional: Negative for activity change, appetite change, diaphoresis, fatigue and fever.  HENT: Negative for congestion.   Respiratory: Negative for chest tightness and shortness of breath.   Cardiovascular: Negative for chest pain and leg swelling.  Gastrointestinal: Negative for abdominal pain.  Genitourinary: Negative for dysuria.  Musculoskeletal: Negative for arthralgias.  Psychiatric/Behavioral: The patient is not nervous/anxious.      Physical Exam Updated Vital Signs BP 110/66   Pulse 75   Temp 98 F (36.7 C) (Oral)   Resp 18   Wt 79.5 kg   LMP 09/21/2018   SpO2 100%   BMI 25.87 kg/m   Physical Exam Constitutional:      General: She is not in acute distress.    Appearance: Normal appearance. She is not ill-appearing.  HENT:     Head: Normocephalic and atraumatic.     Right Ear: External ear normal.     Left Ear: External ear normal.     Nose: Nose normal. No congestion.     Mouth/Throat:     Mouth: Mucous membranes are moist.  Eyes:      Extraocular Movements: Extraocular movements intact.     Conjunctiva/sclera: Conjunctivae normal.  Neck:     Musculoskeletal: Normal range of motion.  Cardiovascular:     Rate and Rhythm: Normal rate and regular rhythm.     Pulses: Normal pulses.     Heart sounds: Normal heart sounds.  Pulmonary:     Effort: Pulmonary effort is normal.     Breath sounds: Normal breath sounds.  Abdominal:     General: Abdomen is flat. Bowel sounds are normal.     Palpations: Abdomen is soft.  Musculoskeletal: Normal range of motion.        General: No swelling (no discrepancy in diameter of R and L lower extemities) or tenderness.     Right lower leg: No edema.     Left lower leg: No edema.  Neurological:     General: No focal deficit present.     Mental Status: She is alert and oriented to person, place, and time.  Psychiatric:        Mood and Affect: Mood normal.        Behavior: Behavior normal.      ED Treatments / Results  Labs (all labs ordered are listed, but only abnormal results are displayed) Labs Reviewed  BASIC METABOLIC PANEL    EKG None  Radiology Vas [place] Lower Extremity Venous (dvt) (only [facility])  Result Date: 09/28/2018  Lower Venous Study Indications: Pain. Other Indications: PE and LLE DVT 07/2018, currently on anticoagulants. Comparison Study: no prior at this facility Performing Technologist: Jeb LeveringJill Parker RDMS, RVT  Examination Guidelines: A complete evaluation includes B-mode imaging, spectral Doppler, color Doppler, and power Doppler as needed of all accessible portions of each vessel. Bilateral testing is considered an integral part of a complete examination. Limited examinations for reoccurring indications may be performed as noted.  +---------+---------------+---------+-----------+----------+-------+ RIGHT    CompressibilityPhasicitySpontaneityPropertiesSummary +---------+---------------+---------+-----------+----------+-------+ CFV      Full           Yes       Yes                          +---------+---------------+---------+-----------+----------+-------+ SFJ      Full                                                 +---------+---------------+---------+-----------+----------+-------+ FV Prox  Full                                                 +---------+---------------+---------+-----------+----------+-------+  FV Mid   Full                                                 +---------+---------------+---------+-----------+----------+-------+ FV DistalFull                                                 +---------+---------------+---------+-----------+----------+-------+ PFV      Full                                                 +---------+---------------+---------+-----------+----------+-------+ POP      Full           Yes      Yes                          +---------+---------------+---------+-----------+----------+-------+ PTV      Full                                                 +---------+---------------+---------+-----------+----------+-------+ PERO     Full                                                 +---------+---------------+---------+-----------+----------+-------+     Summary: Right: There is no evidence of deep vein thrombosis in the lower extremity.  *See table(s) above for measurements and observations.    Preliminary     Procedures Procedures (including critical care time)  Medications Ordered in ED Medications - No data to display   Initial Impression / Assessment and Plan / ED Course  I have reviewed the triage vital signs and the nursing notes.  Pertinent labs & imaging results that were available during my care of the patient were reviewed by me and considered in my medical decision making (see chart for details).       Given patient's history of multiple blood clots, will obtain ultrasound of right lower extremity to assess for DVT.  Ultrasound is negative for DVT.   Obtain a BMP to look for other causes of her leg cramping, and the BMP was normal.  Patient was reassured of these findings.  She was encouraged to follow-up with hematology for further work-up of her history of multiple blood clots.  She was felt to be appropriate for discharge.  Final Clinical Impressions(s) / ED Diagnoses   Final diagnoses:  Leg cramp    ED Discharge Orders    None       Lennox SoldersWinfrey, Amanda C, MD 09/28/18 1719    Gerhard MunchLockwood, Robert, MD 10/04/18 1022

## 2018-09-28 NOTE — ED Notes (Signed)
An After Visit Summary was printed and given to the patient. Discharge instructions given and no further questions at this time.  

## 2018-09-28 NOTE — Progress Notes (Signed)
RLE venous duplex       has been completed. Preliminary results can be found under CV proc through chart review. Roberth Berling, BS, RDMS, RVT   

## 2018-09-28 NOTE — ED Triage Notes (Signed)
Patient currently is taking Xarelto 20 mg dailyfor a DVT in her left leg and pulmonary embolus. Patient is also wearing compression stockings. Patient  is having right leg cramping and is concerned about a possible blood clot in the right leg.

## 2018-10-16 DIAGNOSIS — I82409 Acute embolism and thrombosis of unspecified deep veins of unspecified lower extremity: Secondary | ICD-10-CM | POA: Diagnosis not present

## 2018-10-16 DIAGNOSIS — I2699 Other pulmonary embolism without acute cor pulmonale: Secondary | ICD-10-CM | POA: Diagnosis not present

## 2018-10-16 DIAGNOSIS — M542 Cervicalgia: Secondary | ICD-10-CM | POA: Diagnosis not present

## 2018-10-16 DIAGNOSIS — N946 Dysmenorrhea, unspecified: Secondary | ICD-10-CM | POA: Diagnosis not present

## 2018-10-16 DIAGNOSIS — Z0000 Encounter for general adult medical examination without abnormal findings: Secondary | ICD-10-CM | POA: Diagnosis not present

## 2018-10-16 DIAGNOSIS — Z23 Encounter for immunization: Secondary | ICD-10-CM | POA: Diagnosis not present

## 2018-11-14 DIAGNOSIS — Z9889 Other specified postprocedural states: Secondary | ICD-10-CM | POA: Diagnosis not present

## 2018-11-14 DIAGNOSIS — G960 Cerebrospinal fluid leak: Secondary | ICD-10-CM | POA: Diagnosis not present

## 2018-12-11 DIAGNOSIS — J810 Acute pulmonary edema: Secondary | ICD-10-CM | POA: Diagnosis not present

## 2018-12-11 DIAGNOSIS — I82402 Acute embolism and thrombosis of unspecified deep veins of left lower extremity: Secondary | ICD-10-CM | POA: Diagnosis not present

## 2018-12-11 DIAGNOSIS — I824Z2 Acute embolism and thrombosis of unspecified deep veins of left distal lower extremity: Secondary | ICD-10-CM | POA: Diagnosis not present

## 2019-01-11 DIAGNOSIS — Z03818 Encounter for observation for suspected exposure to other biological agents ruled out: Secondary | ICD-10-CM | POA: Diagnosis not present

## 2019-01-15 DIAGNOSIS — Z20828 Contact with and (suspected) exposure to other viral communicable diseases: Secondary | ICD-10-CM | POA: Diagnosis not present

## 2019-01-30 DIAGNOSIS — I82512 Chronic embolism and thrombosis of left femoral vein: Secondary | ICD-10-CM | POA: Diagnosis not present

## 2019-01-30 DIAGNOSIS — Z7901 Long term (current) use of anticoagulants: Secondary | ICD-10-CM | POA: Diagnosis not present

## 2019-01-30 DIAGNOSIS — I2782 Chronic pulmonary embolism: Secondary | ICD-10-CM | POA: Diagnosis not present

## 2019-02-16 DIAGNOSIS — I82512 Chronic embolism and thrombosis of left femoral vein: Secondary | ICD-10-CM | POA: Diagnosis not present

## 2019-02-16 DIAGNOSIS — I2782 Chronic pulmonary embolism: Secondary | ICD-10-CM | POA: Diagnosis not present

## 2019-02-16 DIAGNOSIS — Z7901 Long term (current) use of anticoagulants: Secondary | ICD-10-CM | POA: Diagnosis not present

## 2019-03-21 DIAGNOSIS — Z03818 Encounter for observation for suspected exposure to other biological agents ruled out: Secondary | ICD-10-CM | POA: Diagnosis not present

## 2019-04-02 DIAGNOSIS — I82402 Acute embolism and thrombosis of unspecified deep veins of left lower extremity: Secondary | ICD-10-CM | POA: Diagnosis not present

## 2019-04-18 DIAGNOSIS — I2699 Other pulmonary embolism without acute cor pulmonale: Secondary | ICD-10-CM | POA: Diagnosis not present

## 2019-04-18 DIAGNOSIS — M545 Low back pain: Secondary | ICD-10-CM | POA: Diagnosis not present

## 2019-04-18 DIAGNOSIS — I82409 Acute embolism and thrombosis of unspecified deep veins of unspecified lower extremity: Secondary | ICD-10-CM | POA: Diagnosis not present

## 2019-08-01 DIAGNOSIS — I82402 Acute embolism and thrombosis of unspecified deep veins of left lower extremity: Secondary | ICD-10-CM | POA: Diagnosis not present

## 2019-08-01 DIAGNOSIS — I871 Compression of vein: Secondary | ICD-10-CM | POA: Diagnosis not present

## 2019-08-01 DIAGNOSIS — I87009 Postthrombotic syndrome without complications of unspecified extremity: Secondary | ICD-10-CM | POA: Diagnosis not present

## 2019-09-28 DIAGNOSIS — Z981 Arthrodesis status: Secondary | ICD-10-CM | POA: Diagnosis not present

## 2019-09-28 DIAGNOSIS — M48061 Spinal stenosis, lumbar region without neurogenic claudication: Secondary | ICD-10-CM | POA: Diagnosis not present

## 2019-09-28 DIAGNOSIS — M47816 Spondylosis without myelopathy or radiculopathy, lumbar region: Secondary | ICD-10-CM | POA: Diagnosis not present

## 2019-09-28 DIAGNOSIS — M4807 Spinal stenosis, lumbosacral region: Secondary | ICD-10-CM | POA: Diagnosis not present

## 2019-10-24 DIAGNOSIS — I82402 Acute embolism and thrombosis of unspecified deep veins of left lower extremity: Secondary | ICD-10-CM | POA: Diagnosis not present

## 2019-11-30 DIAGNOSIS — Z0000 Encounter for general adult medical examination without abnormal findings: Secondary | ICD-10-CM | POA: Diagnosis not present

## 2019-11-30 DIAGNOSIS — Z13220 Encounter for screening for lipoid disorders: Secondary | ICD-10-CM | POA: Diagnosis not present

## 2019-11-30 DIAGNOSIS — Z131 Encounter for screening for diabetes mellitus: Secondary | ICD-10-CM | POA: Diagnosis not present

## 2019-11-30 DIAGNOSIS — Z23 Encounter for immunization: Secondary | ICD-10-CM | POA: Diagnosis not present

## 2019-12-25 DIAGNOSIS — I82422 Acute embolism and thrombosis of left iliac vein: Secondary | ICD-10-CM | POA: Diagnosis not present

## 2019-12-25 DIAGNOSIS — Z7901 Long term (current) use of anticoagulants: Secondary | ICD-10-CM | POA: Diagnosis not present

## 2019-12-25 DIAGNOSIS — I871 Compression of vein: Secondary | ICD-10-CM | POA: Diagnosis not present

## 2019-12-25 DIAGNOSIS — Z7902 Long term (current) use of antithrombotics/antiplatelets: Secondary | ICD-10-CM | POA: Diagnosis not present

## 2019-12-25 DIAGNOSIS — Z7982 Long term (current) use of aspirin: Secondary | ICD-10-CM | POA: Diagnosis not present

## 2019-12-25 DIAGNOSIS — Z981 Arthrodesis status: Secondary | ICD-10-CM | POA: Diagnosis not present
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
## 2020-03-21 DIAGNOSIS — I82402 Acute embolism and thrombosis of unspecified deep veins of left lower extremity: Secondary | ICD-10-CM | POA: Diagnosis not present

## 2020-03-21 DIAGNOSIS — Z7902 Long term (current) use of antithrombotics/antiplatelets: Secondary | ICD-10-CM | POA: Diagnosis not present

## 2020-03-21 DIAGNOSIS — Y838 Other surgical procedures as the cause of abnormal reaction of the patient, or of later complication, without mention of misadventure at the time of the procedure: Secondary | ICD-10-CM | POA: Diagnosis not present

## 2020-03-21 DIAGNOSIS — Z7982 Long term (current) use of aspirin: Secondary | ICD-10-CM | POA: Diagnosis not present

## 2020-03-21 DIAGNOSIS — I871 Compression of vein: Secondary | ICD-10-CM | POA: Diagnosis not present

## 2020-03-21 DIAGNOSIS — Y832 Surgical operation with anastomosis, bypass or graft as the cause of abnormal reaction of the patient, or of later complication, without mention of misadventure at the time of the procedure: Secondary | ICD-10-CM | POA: Diagnosis not present

## 2020-03-21 DIAGNOSIS — T82858A Stenosis of vascular prosthetic devices, implants and grafts, initial encounter: Secondary | ICD-10-CM | POA: Diagnosis not present

## 2020-03-21 DIAGNOSIS — Z7901 Long term (current) use of anticoagulants: Secondary | ICD-10-CM | POA: Diagnosis not present

## 2020-06-18 DIAGNOSIS — I871 Compression of vein: Secondary | ICD-10-CM | POA: Diagnosis not present

## 2020-06-18 DIAGNOSIS — I82402 Acute embolism and thrombosis of unspecified deep veins of left lower extremity: Secondary | ICD-10-CM | POA: Diagnosis not present

## 2020-06-27 DIAGNOSIS — E7800 Pure hypercholesterolemia, unspecified: Secondary | ICD-10-CM | POA: Diagnosis not present

## 2021-03-27 DIAGNOSIS — I82409 Acute embolism and thrombosis of unspecified deep veins of unspecified lower extremity: Secondary | ICD-10-CM | POA: Diagnosis not present

## 2021-03-27 DIAGNOSIS — K5909 Other constipation: Secondary | ICD-10-CM | POA: Diagnosis not present

## 2021-03-27 DIAGNOSIS — Z0000 Encounter for general adult medical examination without abnormal findings: Secondary | ICD-10-CM | POA: Diagnosis not present

## 2021-03-27 DIAGNOSIS — E7800 Pure hypercholesterolemia, unspecified: Secondary | ICD-10-CM | POA: Diagnosis not present

## 2021-03-27 DIAGNOSIS — M542 Cervicalgia: Secondary | ICD-10-CM | POA: Diagnosis not present

## 2021-04-07 DIAGNOSIS — I871 Compression of vein: Secondary | ICD-10-CM | POA: Diagnosis not present

## 2021-04-07 DIAGNOSIS — I82402 Acute embolism and thrombosis of unspecified deep veins of left lower extremity: Secondary | ICD-10-CM | POA: Diagnosis not present

## 2021-04-07 DIAGNOSIS — Z86718 Personal history of other venous thrombosis and embolism: Secondary | ICD-10-CM | POA: Diagnosis not present

## 2021-06-19 DIAGNOSIS — Z3141 Encounter for fertility testing: Secondary | ICD-10-CM | POA: Diagnosis not present

## 2021-06-19 DIAGNOSIS — I82409 Acute embolism and thrombosis of unspecified deep veins of unspecified lower extremity: Secondary | ICD-10-CM | POA: Insufficient documentation

## 2021-06-19 DIAGNOSIS — I871 Compression of vein: Secondary | ICD-10-CM | POA: Diagnosis not present

## 2021-06-19 DIAGNOSIS — Z862 Personal history of diseases of the blood and blood-forming organs and certain disorders involving the immune mechanism: Secondary | ICD-10-CM | POA: Insufficient documentation

## 2021-06-30 ENCOUNTER — Ambulatory Visit: Payer: BC Managed Care – PPO | Admitting: *Deleted

## 2021-06-30 ENCOUNTER — Encounter: Payer: Self-pay | Admitting: *Deleted

## 2021-06-30 ENCOUNTER — Ambulatory Visit: Payer: BC Managed Care – PPO | Attending: Obstetrics and Gynecology | Admitting: Maternal & Fetal Medicine

## 2021-06-30 DIAGNOSIS — Z86711 Personal history of pulmonary embolism: Secondary | ICD-10-CM

## 2021-06-30 DIAGNOSIS — F4320 Adjustment disorder, unspecified: Secondary | ICD-10-CM | POA: Diagnosis not present

## 2021-06-30 DIAGNOSIS — I871 Compression of vein: Secondary | ICD-10-CM | POA: Diagnosis not present

## 2021-06-30 DIAGNOSIS — Z3169 Encounter for other general counseling and advice on procreation: Secondary | ICD-10-CM | POA: Insufficient documentation

## 2021-06-30 DIAGNOSIS — Z981 Arthrodesis status: Secondary | ICD-10-CM | POA: Insufficient documentation

## 2021-06-30 DIAGNOSIS — Z319 Encounter for procreative management, unspecified: Secondary | ICD-10-CM

## 2021-06-30 DIAGNOSIS — M62830 Muscle spasm of back: Secondary | ICD-10-CM | POA: Insufficient documentation

## 2021-06-30 DIAGNOSIS — Z86718 Personal history of other venous thrombosis and embolism: Secondary | ICD-10-CM | POA: Diagnosis not present

## 2021-06-30 NOTE — Progress Notes (Signed)
BP 117/51 P 71 ?
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
## 2021-07-15 DIAGNOSIS — F4320 Adjustment disorder, unspecified: Secondary | ICD-10-CM | POA: Diagnosis not present

## 2021-07-22 ENCOUNTER — Ambulatory Visit: Payer: BC Managed Care – PPO | Admitting: Podiatry

## 2021-07-22 ENCOUNTER — Encounter: Payer: Self-pay | Admitting: Podiatry

## 2021-07-22 DIAGNOSIS — L600 Ingrowing nail: Secondary | ICD-10-CM | POA: Diagnosis not present

## 2021-07-22 DIAGNOSIS — M545 Low back pain, unspecified: Secondary | ICD-10-CM | POA: Insufficient documentation

## 2021-07-22 DIAGNOSIS — N946 Dysmenorrhea, unspecified: Secondary | ICD-10-CM | POA: Insufficient documentation

## 2021-07-22 DIAGNOSIS — Q279 Congenital malformation of peripheral vascular system, unspecified: Secondary | ICD-10-CM | POA: Insufficient documentation

## 2021-07-22 DIAGNOSIS — M542 Cervicalgia: Secondary | ICD-10-CM | POA: Insufficient documentation

## 2021-07-22 MED ORDER — DOXYCYCLINE HYCLATE 100 MG PO TABS: 100.0000 mg | ORAL_TABLET | Freq: Two times a day (BID) | ORAL | 0 refills | Status: DC

## 2021-07-22 MED ORDER — NEOMYCIN-POLYMYXIN-HC 1 % OT SOLN: OTIC | 1 refills | Status: AC

## 2021-07-22 NOTE — Progress Notes (Signed)
Subjective:  Patient ID: Bianca Jacobs, female    DOB: 1985-10-31,  MRN: [id number] HPI Chief Complaint  Patient presents with   Toe Pain    Hallux right - lateral border - ingrown x 2 weeks, picked at it and now its infected, soaking and neopsorin   New Patient (Initial Visit)    36 y.o. female presents with the above complaint.   ROS: She denies fever chills nausea vomiting muscle aches pains calf pain back pain chest pain shortness of breath.  She has a history of DVT and PE after back fusion  Past Medical History:  Diagnosis Date   Asthma    As a child   DVT (deep venous thrombosis) ([facility])    Pulmonary embolus ([facility])    Past Surgical History:  Procedure Laterality Date   BACK SURGERY      Current Outpatient Medications:    doxycycline (VIBRA-TABS) 100 MG tablet, Take 1 tablet (100 mg total) by mouth 2 (two) times daily., Disp: 20 tablet, Rfl: 0   levonorgestrel-ethinyl estradiol (SEASONALE) 0.15-0.03 MG tablet, Take 1 tablet by mouth daily., Disp: , Rfl:    NEOMYCIN-POLYMYXIN-HYDROCORTISONE (CORTISPORIN) 1 % SOLN OTIC solution, Apply 1-2 drops to toe BID after soaking, Disp: 10 mL, Rfl: 1   acetaminophen (TYLENOL) 500 MG tablet, Take 1,000 mg by mouth daily as needed for moderate pain or headache., Disp: , Rfl:    aspirin EC 325 MG tablet, Take 325 mg by mouth daily., Disp: , Rfl:    cetirizine (ZYRTEC) 10 MG tablet, Take 10 mg by mouth daily., Disp: , Rfl:    LINZESS 145 MCG CAPS capsule, Take 145 mcg by mouth every morning., Disp: , Rfl:   Allergies  Allergen Reactions   Dust Mite Extract     Other reaction(s): Other (See Comments) Runny nose - patient has environmental allergies   Review of Systems Objective:  There were no vitals filed for this visit.  General: Well developed, nourished, in no acute distress, alert and oriented x3   Dermatological: Skin is warm, dry and supple bilateral. Nails x 10 are well maintained; remaining integument appears unremarkable  at this time. There are no open sores, no preulcerative lesions, no rash or signs of infection present.  Vascular: Dorsalis Pedis artery and Posterior Tibial artery pedal pulses are 2/4 bilateral with immedate capillary fill time. Pedal hair growth present. No varicosities and no lower extremity edema present bilateral.   Neruologic: Grossly intact via light touch bilateral. Vibratory intact via tuning fork bilateral. Protective threshold with Semmes Wienstein monofilament intact to all pedal sites bilateral. Patellar and Achilles deep tendon reflexes 2+ bilateral. No Babinski or clonus noted bilateral.   Musculoskeletal: No gross boney pedal deformities bilateral. No pain, crepitus, or limitation noted with foot and ankle range of motion bilateral. Muscular strength 5/5 in all groups tested bilateral.  No reproducible tenderness on the right foot.  Gait: Unassisted, Nonantalgic.    Radiographs:  None taken but we will take x-rays on the next visit of the right foot.  Assessment & Plan:   Assessment: Ingrown nail paronychia abscess fibular border hallux right.  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  Plan: Chemical matricectomy was performed today tolerated well patient received both oral and written home-going structure for the care and soaking of the toe as well as a prescription for doxycycline Corticosporin otic.  We also discussed her dry skin to the plantar aspect of the foot discussed the use of O'Keefe's foot cream  and Lamisil a T cream.  We also discussed getting x-rays on her next visit for her a.m. pain to the dorsal aspect of her right foot.     Maxima Skelton T. [place], [place]

## 2021-07-22 NOTE — Patient Instructions (Signed)

## 2021-08-04 DIAGNOSIS — Z3162 Encounter for fertility preservation counseling: Secondary | ICD-10-CM | POA: Diagnosis not present

## 2021-08-04 DIAGNOSIS — F4320 Adjustment disorder, unspecified: Secondary | ICD-10-CM | POA: Diagnosis not present

## 2021-08-05 ENCOUNTER — Ambulatory Visit (INDEPENDENT_AMBULATORY_CARE_PROVIDER_SITE_OTHER): Payer: BC Managed Care – PPO

## 2021-08-05 ENCOUNTER — Encounter: Payer: Self-pay | Admitting: Podiatry

## 2021-08-05 ENCOUNTER — Ambulatory Visit: Payer: BC Managed Care – PPO | Admitting: Podiatry

## 2021-08-05 DIAGNOSIS — L600 Ingrowing nail: Secondary | ICD-10-CM

## 2021-08-05 DIAGNOSIS — Z9889 Other specified postprocedural states: Secondary | ICD-10-CM | POA: Diagnosis not present

## 2021-08-05 DIAGNOSIS — M778 Other enthesopathies, not elsewhere classified: Secondary | ICD-10-CM

## 2021-08-05 DIAGNOSIS — Z319 Encounter for procreative management, unspecified: Secondary | ICD-10-CM | POA: Diagnosis not present

## 2021-08-05 NOTE — Progress Notes (Signed)
She presents today for follow-up of her right foot.  Nail procedure fibular border she states that it looks good has been doing well's been soaking in Betadine and no problem she says it certainly does not hurt.  She still having morning pain in the midfoot dorsally she states that aches for past few months may be getting a little bit better did not Clines any injury she states that noticed that certain pairs of shoes aggravate it the pain is worse in the morning and very rarely goes more than an hour.  Objective: Vital signs are stable she is alert and oriented x3 pulses are palpable.  She has normal healing matrixectomy which is mild erythema there is no purulence or no malodor.  Dorsal aspect of the foot demonstrates no reproducible pain on any range of motion tendons ligaments osseously.  Radiographs do not demonstrate any type of osseous abnormalities.  Assessment probable sprain or ligamentous injury to the dorsal aspect of the right foot.  Well-healing matrixectomy right foot.  Plan: Discussed etiology pathology conservative versus surgical therapies recommended that if her foot continues to hurt she will notify me for MRI request.  Also she is to continue to soak her great toe daily or every other day and Epsom salts and warm water until all of the erythema has subsided.  She will cover during the day but leave open at bedtime.

## 2021-08-25 DIAGNOSIS — F4320 Adjustment disorder, unspecified: Secondary | ICD-10-CM | POA: Diagnosis not present

## 2021-08-28 DIAGNOSIS — Z3162 Encounter for fertility preservation counseling: Secondary | ICD-10-CM | POA: Diagnosis not present

## 2021-09-08 DIAGNOSIS — F4320 Adjustment disorder, unspecified: Secondary | ICD-10-CM | POA: Diagnosis not present

## 2021-10-08 DIAGNOSIS — F4320 Adjustment disorder, unspecified: Secondary | ICD-10-CM | POA: Diagnosis not present

## 2021-10-27 DIAGNOSIS — F4320 Adjustment disorder, unspecified: Secondary | ICD-10-CM | POA: Diagnosis not present

## 2021-12-01 DIAGNOSIS — F4323 Adjustment disorder with mixed anxiety and depressed mood: Secondary | ICD-10-CM | POA: Diagnosis not present

## 2021-12-01 DIAGNOSIS — F4320 Adjustment disorder, unspecified: Secondary | ICD-10-CM | POA: Diagnosis not present

## 2021-12-17 DIAGNOSIS — F4320 Adjustment disorder, unspecified: Secondary | ICD-10-CM | POA: Diagnosis not present

## 2021-12-30 DIAGNOSIS — F4320 Adjustment disorder, unspecified: Secondary | ICD-10-CM | POA: Diagnosis not present

## 2022-01-14 DIAGNOSIS — F4320 Adjustment disorder, unspecified: Secondary | ICD-10-CM | POA: Diagnosis not present

## 2022-02-17 DIAGNOSIS — F4320 Adjustment disorder, unspecified: Secondary | ICD-10-CM | POA: Diagnosis not present

## 2022-03-03 DIAGNOSIS — F4320 Adjustment disorder, unspecified: Secondary | ICD-10-CM | POA: Diagnosis not present

## 2022-03-30 DIAGNOSIS — F4320 Adjustment disorder, unspecified: Secondary | ICD-10-CM | POA: Diagnosis not present

## 2022-04-21 DIAGNOSIS — F4320 Adjustment disorder, unspecified: Secondary | ICD-10-CM | POA: Diagnosis not present

## 2022-06-08 DIAGNOSIS — F4320 Adjustment disorder, unspecified: Secondary | ICD-10-CM | POA: Diagnosis not present

## 2022-06-18 DIAGNOSIS — M542 Cervicalgia: Secondary | ICD-10-CM | POA: Diagnosis not present

## 2022-06-18 DIAGNOSIS — Z0000 Encounter for general adult medical examination without abnormal findings: Secondary | ICD-10-CM | POA: Diagnosis not present

## 2022-06-18 DIAGNOSIS — E7800 Pure hypercholesterolemia, unspecified: Secondary | ICD-10-CM | POA: Diagnosis not present

## 2022-06-18 DIAGNOSIS — I82409 Acute embolism and thrombosis of unspecified deep veins of unspecified lower extremity: Secondary | ICD-10-CM | POA: Diagnosis not present

## 2022-06-18 DIAGNOSIS — K5909 Other constipation: Secondary | ICD-10-CM | POA: Diagnosis not present

## 2022-07-06 DIAGNOSIS — F4320 Adjustment disorder, unspecified: Secondary | ICD-10-CM | POA: Diagnosis not present

## 2022-07-07 DIAGNOSIS — Z31430 Encounter of female for testing for genetic disease carrier status for procreative management: Secondary | ICD-10-CM | POA: Diagnosis not present

## 2022-07-07 DIAGNOSIS — Z319 Encounter for procreative management, unspecified: Secondary | ICD-10-CM | POA: Diagnosis not present

## 2022-07-07 DIAGNOSIS — E559 Vitamin D deficiency, unspecified: Secondary | ICD-10-CM | POA: Diagnosis not present

## 2022-07-07 DIAGNOSIS — Z3183 Encounter for assisted reproductive fertility procedure cycle: Secondary | ICD-10-CM | POA: Diagnosis not present

## 2022-07-07 DIAGNOSIS — Z3162 Encounter for fertility preservation counseling: Secondary | ICD-10-CM | POA: Diagnosis not present

## 2022-07-12 DIAGNOSIS — Z3162 Encounter for fertility preservation counseling: Secondary | ICD-10-CM | POA: Diagnosis not present

## 2022-07-12 DIAGNOSIS — Z319 Encounter for procreative management, unspecified: Secondary | ICD-10-CM | POA: Diagnosis not present

## 2022-07-16 DIAGNOSIS — Z31430 Encounter of female for testing for genetic disease carrier status for procreative management: Secondary | ICD-10-CM | POA: Diagnosis not present

## 2022-07-16 DIAGNOSIS — Z3162 Encounter for fertility preservation counseling: Secondary | ICD-10-CM | POA: Diagnosis not present

## 2022-07-26 DIAGNOSIS — Z3162 Encounter for fertility preservation counseling: Secondary | ICD-10-CM | POA: Diagnosis not present

## 2022-07-26 DIAGNOSIS — Z31430 Encounter of female for testing for genetic disease carrier status for procreative management: Secondary | ICD-10-CM | POA: Diagnosis not present

## 2022-07-31 DIAGNOSIS — Z31430 Encounter of female for testing for genetic disease carrier status for procreative management: Secondary | ICD-10-CM | POA: Diagnosis not present

## 2022-07-31 DIAGNOSIS — Z3162 Encounter for fertility preservation counseling: Secondary | ICD-10-CM | POA: Diagnosis not present

## 2022-08-05 DIAGNOSIS — F4320 Adjustment disorder, unspecified: Secondary | ICD-10-CM | POA: Diagnosis not present

## 2022-08-12 DIAGNOSIS — F4320 Adjustment disorder, unspecified: Secondary | ICD-10-CM | POA: Diagnosis not present

## 2022-08-16 DIAGNOSIS — E2839 Other primary ovarian failure: Secondary | ICD-10-CM | POA: Diagnosis not present

## 2022-08-19 DIAGNOSIS — Z01419 Encounter for gynecological examination (general) (routine) without abnormal findings: Secondary | ICD-10-CM | POA: Diagnosis not present

## 2022-08-19 DIAGNOSIS — Z6824 Body mass index (BMI) 24.0-24.9, adult: Secondary | ICD-10-CM | POA: Diagnosis not present

## 2022-08-23 DIAGNOSIS — Z31430 Encounter of female for testing for genetic disease carrier status for procreative management: Secondary | ICD-10-CM | POA: Diagnosis not present

## 2022-08-23 DIAGNOSIS — E288 Other ovarian dysfunction: Secondary | ICD-10-CM | POA: Diagnosis not present

## 2022-08-23 DIAGNOSIS — Z3162 Encounter for fertility preservation counseling: Secondary | ICD-10-CM | POA: Diagnosis not present

## 2022-08-23 DIAGNOSIS — Z319 Encounter for procreative management, unspecified: Secondary | ICD-10-CM | POA: Diagnosis not present

## 2022-08-23 DIAGNOSIS — Z3183 Encounter for assisted reproductive fertility procedure cycle: Secondary | ICD-10-CM | POA: Diagnosis not present

## 2022-08-25 DIAGNOSIS — E288 Other ovarian dysfunction: Secondary | ICD-10-CM | POA: Diagnosis not present

## 2022-08-25 DIAGNOSIS — N83291 Other ovarian cyst, right side: Secondary | ICD-10-CM | POA: Diagnosis not present

## 2022-08-25 DIAGNOSIS — Z3183 Encounter for assisted reproductive fertility procedure cycle: Secondary | ICD-10-CM | POA: Diagnosis not present

## 2022-08-25 DIAGNOSIS — N83292 Other ovarian cyst, left side: Secondary | ICD-10-CM | POA: Diagnosis not present

## 2022-08-25 DIAGNOSIS — Z319 Encounter for procreative management, unspecified: Secondary | ICD-10-CM | POA: Diagnosis not present

## 2022-09-01 DIAGNOSIS — Z3162 Encounter for fertility preservation counseling: Secondary | ICD-10-CM | POA: Diagnosis not present

## 2022-09-01 DIAGNOSIS — N83292 Other ovarian cyst, left side: Secondary | ICD-10-CM | POA: Diagnosis not present

## 2022-09-04 DIAGNOSIS — E288 Other ovarian dysfunction: Secondary | ICD-10-CM | POA: Diagnosis not present

## 2022-09-04 DIAGNOSIS — Z31430 Encounter of female for testing for genetic disease carrier status for procreative management: Secondary | ICD-10-CM | POA: Diagnosis not present

## 2022-09-04 DIAGNOSIS — Z3162 Encounter for fertility preservation counseling: Secondary | ICD-10-CM | POA: Diagnosis not present

## 2022-09-04 DIAGNOSIS — Z3183 Encounter for assisted reproductive fertility procedure cycle: Secondary | ICD-10-CM | POA: Diagnosis not present

## 2022-09-06 DIAGNOSIS — F4320 Adjustment disorder, unspecified: Secondary | ICD-10-CM | POA: Diagnosis not present

## 2022-09-27 DIAGNOSIS — F4320 Adjustment disorder, unspecified: Secondary | ICD-10-CM | POA: Diagnosis not present

## 2022-10-25 DIAGNOSIS — F4320 Adjustment disorder, unspecified: Secondary | ICD-10-CM | POA: Diagnosis not present
